# Patient Record
Sex: Male | Born: 1965 | Race: Black or African American | Hispanic: No | Marital: Married | Smoking: Never smoker
Health system: Southern US, Community
[De-identification: ages and names within clinical notes are randomized; demographics above are authoritative.]

## PROBLEM LIST (undated history)

## (undated) DIAGNOSIS — I10 Essential (primary) hypertension: Secondary | ICD-10-CM

---

## 2015-11-04 DIAGNOSIS — E669 Obesity, unspecified: Secondary | ICD-10-CM | POA: Insufficient documentation

## 2015-11-04 DIAGNOSIS — I10 Essential (primary) hypertension: Secondary | ICD-10-CM | POA: Insufficient documentation

## 2020-02-18 ENCOUNTER — Telehealth: Payer: Self-pay | Admitting: Emergency Medicine

## 2020-02-18 ENCOUNTER — Encounter: Payer: Self-pay | Admitting: Emergency Medicine

## 2020-02-18 ENCOUNTER — Other Ambulatory Visit: Payer: Self-pay

## 2020-02-18 ENCOUNTER — Emergency Department
Admission: EM | Admit: 2020-02-18 | Discharge: 2020-02-18 | Disposition: A | Discharge status: Home / Self Care | Payer: PRIVATE HEALTH INSURANCE | Source: Home / Self Care

## 2020-02-18 DIAGNOSIS — I10 Essential (primary) hypertension: Secondary | ICD-10-CM

## 2020-02-18 DIAGNOSIS — J069 Acute upper respiratory infection, unspecified: Secondary | ICD-10-CM

## 2020-02-18 DIAGNOSIS — J029 Acute pharyngitis, unspecified: Secondary | ICD-10-CM

## 2020-02-18 DIAGNOSIS — H9201 Otalgia, right ear: Secondary | ICD-10-CM

## 2020-02-18 DIAGNOSIS — Z760 Encounter for issue of repeat prescription: Secondary | ICD-10-CM

## 2020-02-18 HISTORY — DX: Essential (primary) hypertension: I10

## 2020-02-18 LAB — POCT RAPID STREP A (OFFICE): Rapid Strep A Screen: NEGATIVE

## 2020-02-18 MED ORDER — AMLODIPINE BESYLATE 10 MG PO TABS: 10.0000 mg | ORAL_TABLET | Freq: Every day | ORAL | 0 refills | Status: DC

## 2020-02-18 NOTE — Discharge Instructions (Signed)
You may take 500mg  acetaminophen every 4-6 hours or in combination with ibuprofen 400-600mg  every 6-8 hours as needed for pain, inflammation, and fever.  Be sure to well hydrated with clear liquids and get at least 8 hours of sleep at night, preferably more while sick.   Please follow up with family medicine in 1 week if needed.  Be sure to take your blood pressure medication as prescribed to help lower your risk of heart attack, stroke, kidney failure and early death.  Call to schedule a follow up appointment with primary care for ongoing healthcare needs and recheck of symptoms next week.

## 2020-02-18 NOTE — ED Triage Notes (Signed)
Right ear pain and r sided throat pain started yesterday -was sharp, no pain now  Denies fever or chills Took theraflu last night  Missed work today -will need a work note COVID vaccine - 2nd shot [employer] 12/22/19

## 2020-02-18 NOTE — Telephone Encounter (Signed)
Order for strep culture did not process at patient's visit earlier today - order placed for strep throat culture - sent out today

## 2020-02-18 NOTE — ED Provider Notes (Addendum)
[facility]    CSN: [id number] Arrival date & time: 02/18/20  0815      History   Chief Complaint Chief Complaint  Patient presents with  . Otalgia  . Sore Throat    HPI Oscar Miller is a 54 y.o. male.   HPI Oscar Miller is a 54 y.o. male presenting to UC with c/o Right ear pain and Right side throat pain that started yesterday.  Pain was sharp, associated post nasal drainage. He took Theraflu last night, which provided relief.  Denies pain at this time but has mild congestion.  Denies fever, chills, n/v/d. Others at work have also been sick this week. He is requesting a work note. Fully vaccinated with Pfizer COVID vaccine 12/22/19.   BP elevated in triage. Pt has been out of his amlodipine for a few months due to lack of PCP from lack of insurance. Pt recently received insurance and currently looking for a new PCP. Denies HA, dizziness, chest pain or SOB.  Past Medical History:  Diagnosis Date  . Hypertension     Patient Active Problem List   Diagnosis Date Noted  . Essential hypertension 11/04/2015  . Obesity 11/04/2015    History reviewed. No pertinent surgical history.     Home Medications    Prior to Admission medications   Medication Sig Start Date End Date Taking? Authorizing Provider  amLODipine (NORVASC) 10 MG tablet Take 1 tablet (10 mg total) by mouth daily. 02/18/20   Lurene Shadow, PA-C    Family History Family History  Problem Relation Age of Onset  . Heart failure Mother   . COPD Father     Social History Social History   Tobacco Use  . Smoking status: Never Smoker  . Smokeless tobacco: Never Used  Vaping Use  . Vaping Use: Never used  Substance Use Topics  . Alcohol use: Yes    Alcohol/week: 1.0 standard drink    Types: 1 Standard drinks or equivalent per week  . Drug use: Never     Allergies   Patient has no known allergies.   Review of Systems Review of Systems  Constitutional: Negative for chills and fever.   HENT: Positive for congestion, ear pain (Right) and sore throat. Negative for trouble swallowing and voice change.   Respiratory: Positive for cough ( minimal, clearing his throat). Negative for shortness of breath.   Cardiovascular: Negative for chest pain and palpitations.  Gastrointestinal: Negative for abdominal pain, diarrhea, nausea and vomiting.  Musculoskeletal: Negative for arthralgias, back pain and myalgias.  Skin: Negative for rash.  All other systems reviewed and are negative.    Physical Exam Triage Vital Signs ED Triage Vitals  Enc Vitals Group     BP 02/18/20 0837 (!) 163/115     Pulse Rate 02/18/20 0837 (!) 108     Resp 02/18/20 0837 18     Temp 02/18/20 0837 98.9 F (37.2 C)     Temp Source 02/18/20 0837 Oral     SpO2 02/18/20 0837 97 %     Weight 02/18/20 0841 238 lb (108 kg)     Height 02/18/20 0841 5\' 10"  (1.778 m)     Head Circumference --      Peak Flow --      Pain Score 02/18/20 0840 0     Pain Loc --      Pain Edu? --      Excl. in GC? --    No data found.  Updated  Vital Signs BP (!) 150/94 (BP Location: Right Arm)   Pulse 95   Temp 98.9 F (37.2 C) (Oral)   Resp 18   Ht 5\' 10"  (1.778 m)   Wt 238 lb (108 kg)   SpO2 97%   BMI 34.15 kg/m   Visual Acuity Right Eye Distance:   Left Eye Distance:   Bilateral Distance:    Right Eye Near:   Left Eye Near:    Bilateral Near:     Physical Exam Vitals and nursing note reviewed.  Constitutional:      General: He is not in acute distress.    Appearance: He is well-developed. He is not ill-appearing, toxic-appearing or diaphoretic.  HENT:     Head: Normocephalic and atraumatic.     Right Ear: Tympanic membrane and ear canal normal.     Left Ear: Tympanic membrane and ear canal normal.     Nose: Nose normal.     Right Sinus: No maxillary sinus tenderness or frontal sinus tenderness.     Left Sinus: No maxillary sinus tenderness or frontal sinus tenderness.     Mouth/Throat:     Lips:  Pink.     Mouth: Mucous membranes are moist.     Pharynx: Oropharynx is clear. Uvula midline. Posterior oropharyngeal erythema present. No pharyngeal swelling, oropharyngeal exudate or uvula swelling.  Cardiovascular:     Rate and Rhythm: Normal rate and regular rhythm.  Pulmonary:     Effort: Pulmonary effort is normal. No respiratory distress.     Breath sounds: Normal breath sounds. No stridor. No wheezing, rhonchi or rales.  Musculoskeletal:        General: Normal range of motion.     Cervical back: Normal range of motion and neck supple.  Lymphadenopathy:     Cervical: No cervical adenopathy.  Skin:    General: Skin is warm and dry.  Neurological:     Mental Status: He is alert and oriented to person, place, and time.  Psychiatric:        Behavior: Behavior normal.      UC Treatments / Results  Labs (all labs ordered are listed, but only abnormal results are displayed) Labs Reviewed  POCT RAPID STREP A (OFFICE)    EKG   Radiology No results found.  Procedures Procedures (including critical care time)  Medications Ordered in UC Medications - No data to display  Initial Impression / Assessment and Plan / UC Course  I have reviewed the triage vital signs and the nursing notes.  Pertinent labs & imaging results that were available during my care of the patient were reviewed by me and considered in my medical decision making (see chart for details).    Rapid strep Negative Hx and exam c/w viral illness Encouraged symptomatic tx AVS given  BP medication refilled. Encouraged to establish care with a PCP. AVS given.   Final Clinical Impressions(s) / UC Diagnoses   Final diagnoses:  Acute pharyngitis, unspecified etiology  Viral upper respiratory illness  Right ear pain  Uncontrolled hypertension  Medication refill     Discharge Instructions     You may take 500mg  acetaminophen every 4-6 hours or in combination with ibuprofen 400-600mg  every 6-8 hours  as needed for pain, inflammation, and fever.  Be sure to well hydrated with clear liquids and get at least 8 hours of sleep at night, preferably more while sick.   Please follow up with family medicine in 1 week if needed.  Be sure to  take your blood pressure medication as prescribed to help lower your risk of heart attack, stroke, kidney failure and early death.  Call to schedule a follow up appointment with primary care for ongoing healthcare needs and recheck of symptoms next week.     ED Prescriptions    Medication Sig Dispense Auth. Provider   amLODipine (NORVASC) 10 MG tablet Take 1 tablet (10 mg total) by mouth daily. 30 tablet Lurene Shadow, [place]     PDMP not reviewed this encounter.   Lurene Shadow, PA-C 02/18/20 1052    Lurene Shadow, [place] 02/18/20 1053

## 2020-02-18 NOTE — Telephone Encounter (Signed)
Order for strep culture cancelled - not required at this time per provider. Unable to cancel order- specimen not sent to [facility].

## 2020-02-25 ENCOUNTER — Emergency Department (INDEPENDENT_AMBULATORY_CARE_PROVIDER_SITE_OTHER)
Admission: EM | Admit: 2020-02-25 | Discharge: 2020-02-25 | Disposition: A | Discharge status: Home / Self Care | Payer: PRIVATE HEALTH INSURANCE | Source: Home / Self Care

## 2020-02-25 ENCOUNTER — Other Ambulatory Visit: Payer: Self-pay

## 2020-02-25 ENCOUNTER — Emergency Department (INDEPENDENT_AMBULATORY_CARE_PROVIDER_SITE_OTHER): Payer: PRIVATE HEALTH INSURANCE

## 2020-02-25 DIAGNOSIS — J069 Acute upper respiratory infection, unspecified: Secondary | ICD-10-CM

## 2020-02-25 DIAGNOSIS — J0100 Acute maxillary sinusitis, unspecified: Secondary | ICD-10-CM

## 2020-02-25 DIAGNOSIS — I10 Essential (primary) hypertension: Secondary | ICD-10-CM

## 2020-02-25 MED ORDER — BENZONATATE 100 MG PO CAPS
100.0000 mg | ORAL_CAPSULE | Freq: Three times a day (TID) | ORAL | 0 refills | Status: AC
Start: 2020-02-25 — End: ?

## 2020-02-25 MED ORDER — AMOXICILLIN-POT CLAVULANATE 875-125 MG PO TABS: 1.0000 | ORAL_TABLET | Freq: Two times a day (BID) | ORAL | 0 refills | Status: AC

## 2020-02-25 NOTE — Discharge Instructions (Signed)
  You may take 500mg  acetaminophen every 4-6 hours or in combination with ibuprofen 400-600mg  every 6-8 hours as needed for pain, inflammation, and fever.  Be sure to well hydrated with clear liquids and get at least 8 hours of sleep at night, preferably more while sick.   Please follow up with family medicine next week if not improving and for blood pressure management.

## 2020-02-25 NOTE — ED Provider Notes (Signed)
[facility]    CSN: [id number] Arrival date & time: 02/25/20  1724      History   Chief Complaint Chief Complaint  Patient presents with  . Cough    HPI Oscar Miller is a 54 y.o. male.   HPI  Oscar Miller is a 54 y.o. male presenting to UC with c/o gradually worsening cough, congestion, sinus pressure, sore throat and ear pain. He was seen last week for similar symptoms, the day after they started. Denies fever, chills, n/v/d. No chest pain or SOB but cough has been keeping him up at night.  He was fully vaccinated with Pfizer COVID vaccine beginning of September.    BP elevated. He restarted his BP medication 1 week ago and has new patient appointment with PCP 03/03/20. Denies HA, dizziness, chest pain or SOB.    Past Medical History:  Diagnosis Date  . Hypertension     Patient Active Problem List   Diagnosis Date Noted  . Essential hypertension 11/04/2015  . Obesity 11/04/2015    History reviewed. No pertinent surgical history.     Home Medications    Prior to Admission medications   Medication Sig Start Date End Date Taking? Authorizing Provider  amLODipine (NORVASC) 10 MG tablet Take 1 tablet (10 mg total) by mouth daily. 02/18/20  Yes Duru Reiger O, PA-C  amoxicillin-clavulanate (AUGMENTIN) 875-125 MG tablet Take 1 tablet by mouth 2 (two) times daily. One po bid x 7 days 02/25/20   Lurene Shadow, PA-C  benzonatate (TESSALON) 100 MG capsule Take 1-2 capsules (100-200 mg total) by mouth every 8 (eight) hours. 02/25/20   Lurene Shadow, PA-C    Family History Family History  Problem Relation Age of Onset  . Heart failure Mother   . COPD Father     Social History Social History   Tobacco Use  . Smoking status: Never Smoker  . Smokeless tobacco: Never Used  Vaping Use  . Vaping Use: Never used  Substance Use Topics  . Alcohol use: Not Currently    Alcohol/week: 1.0 standard drink    Types: 1 Standard drinks or equivalent per week    . Drug use: Never     Allergies   Patient has no known allergies.   Review of Systems Review of Systems  Constitutional: Negative for chills and fever.  HENT: Positive for congestion, ear pain, sinus pressure, sinus pain, sore throat and voice change (hoarse). Negative for trouble swallowing.   Respiratory: Positive for cough. Negative for shortness of breath.   Cardiovascular: Negative for chest pain and palpitations.  Gastrointestinal: Negative for abdominal pain, diarrhea, nausea and vomiting.  Musculoskeletal: Negative for arthralgias, back pain and myalgias.  Skin: Negative for rash.  All other systems reviewed and are negative.    Physical Exam Triage Vital Signs ED Triage Vitals  Enc Vitals Group     BP 02/25/20 1736 (!) 155/104     Pulse Rate 02/25/20 1736 97     Resp 02/25/20 1736 18     Temp 02/25/20 1736 98.8 F (37.1 C)     Temp Source 02/25/20 1736 Oral     SpO2 02/25/20 1736 97 %     Weight --      Height --      Head Circumference --      Peak Flow --      Pain Score 02/25/20 1735 3     Pain Loc --      Pain Edu? --  Excl. in GC? --    No data found.  Updated Vital Signs BP (!) 155/104 (BP Location: Right Arm)   Pulse 97   Temp 98.8 F (37.1 C) (Oral)   Resp 18   SpO2 97%   Visual Acuity Right Eye Distance:   Left Eye Distance:   Bilateral Distance:    Right Eye Near:   Left Eye Near:    Bilateral Near:     Physical Exam Vitals and nursing note reviewed.  Constitutional:      General: He is not in acute distress.    Appearance: Normal appearance. He is well-developed. He is not ill-appearing, toxic-appearing or diaphoretic.  HENT:     Head: Normocephalic and atraumatic.     Right Ear: Tympanic membrane and ear canal normal.     Left Ear: Tympanic membrane and ear canal normal.     Nose: Congestion present.     Right Sinus: No maxillary sinus tenderness or frontal sinus tenderness.     Left Sinus: Maxillary sinus tenderness  present. No frontal sinus tenderness.     Mouth/Throat:     Lips: Pink.     Mouth: Mucous membranes are moist.     Pharynx: Oropharynx is clear. Uvula midline. No pharyngeal swelling, oropharyngeal exudate, posterior oropharyngeal erythema or uvula swelling.  Cardiovascular:     Rate and Rhythm: Normal rate and regular rhythm.  Pulmonary:     Effort: Pulmonary effort is normal. No respiratory distress.     Breath sounds: Normal breath sounds. No stridor. No wheezing, rhonchi or rales.  Musculoskeletal:        General: Normal range of motion.     Cervical back: Normal range of motion and neck supple. No tenderness.  Lymphadenopathy:     Cervical: No cervical adenopathy.  Skin:    General: Skin is warm and dry.  Neurological:     Mental Status: He is alert and oriented to person, place, and time.  Psychiatric:        Behavior: Behavior normal.      UC Treatments / Results  Labs (all labs ordered are listed, but only abnormal results are displayed) Labs Reviewed  NOVEL CORONAVIRUS, NAA    EKG   Radiology DG Chest 2 View  Result Date: 02/25/2020 CLINICAL DATA:  Cough and congestion for 5 days EXAM: CHEST - 2 VIEW COMPARISON:  None. FINDINGS: The heart size and mediastinal contours are within normal limits. Both lungs are clear. The visualized skeletal structures are unremarkable. IMPRESSION: No active cardiopulmonary disease. Electronically Signed   By: Sharlet Salina M.D.   On: 02/25/2020 18:29    Procedures Procedures (including critical care time)  Medications Ordered in UC Medications - No data to display  Initial Impression / Assessment and Plan / UC Course  I have reviewed the triage vital signs and the nursing notes.  Pertinent labs & imaging results that were available during my care of the patient were reviewed by me and considered in my medical decision making (see chart for details).    Discussed imaging with pt Will tx sinus symptoms with Augmentin,  encouraged sinus rinses F/u with PCP next week as previously scheduled for symptom and BP recheck. AVS given.  Final Clinical Impressions(s) / UC Diagnoses   Final diagnoses:  Acute upper respiratory infection  Acute non-recurrent maxillary sinusitis  Uncontrolled hypertension     Discharge Instructions      You may take 500mg  acetaminophen every 4-6 hours or in combination with  ibuprofen 400-600mg  every 6-8 hours as needed for pain, inflammation, and fever.  Be sure to well hydrated with clear liquids and get at least 8 hours of sleep at night, preferably more while sick.   Please follow up with family medicine next week if not improving and for blood pressure management.      ED Prescriptions    Medication Sig Dispense Auth. Provider   amoxicillin-clavulanate (AUGMENTIN) 875-125 MG tablet Take 1 tablet by mouth 2 (two) times daily. One po bid x 7 days 14 tablet Ruthann Angulo O, PA-C   benzonatate (TESSALON) 100 MG capsule Take 1-2 capsules (100-200 mg total) by mouth every 8 (eight) hours. 21 capsule Lurene Shadow, [place]     PDMP not reviewed this encounter.   Lurene Shadow, [place] 02/25/20 1911

## 2020-02-25 NOTE — ED Triage Notes (Signed)
Patient presents to Urgent Care with complaints of continued sinus pressure, sore throat, ear pain, and cough since about 8 days ago. Patient reports he was evaluated last week and was not given anything for his symptoms. Thinks he needs an antibiotic, was not tested for covid last week, was tested for strep and it was negative. Would like a work note.

## 2020-02-27 LAB — NOVEL CORONAVIRUS, NAA: SARS-CoV-2, NAA: NOT DETECTED

## 2020-02-27 LAB — SARS-COV-2, NAA 2 DAY TAT

## 2020-03-03 ENCOUNTER — Ambulatory Visit: Payer: PRIVATE HEALTH INSURANCE | Admitting: Family Medicine

## 2020-04-01 ENCOUNTER — Ambulatory Visit: Payer: PRIVATE HEALTH INSURANCE | Admitting: Family Medicine

## 2020-04-22 ENCOUNTER — Ambulatory Visit: Payer: PRIVATE HEALTH INSURANCE | Admitting: Family Medicine

## 2020-06-03 ENCOUNTER — Emergency Department (INDEPENDENT_AMBULATORY_CARE_PROVIDER_SITE_OTHER)
Admission: EM | Admit: 2020-06-03 | Discharge: 2020-06-03 | Disposition: A | Discharge status: Home / Self Care | Payer: PRIVATE HEALTH INSURANCE | Source: Home / Self Care

## 2020-06-03 DIAGNOSIS — I10 Essential (primary) hypertension: Secondary | ICD-10-CM | POA: Diagnosis not present

## 2020-06-03 DIAGNOSIS — J3489 Other specified disorders of nose and nasal sinuses: Secondary | ICD-10-CM

## 2020-06-03 DIAGNOSIS — R0981 Nasal congestion: Secondary | ICD-10-CM

## 2020-06-03 MED ORDER — IPRATROPIUM BROMIDE 0.03 % NA SOLN: 2.0000 | Freq: Three times a day (TID) | NASAL | 0 refills | Status: AC | PRN

## 2020-06-03 MED ORDER — LEVOCETIRIZINE DIHYDROCHLORIDE 5 MG PO TABS: 5.0000 mg | ORAL_TABLET | Freq: Every evening | ORAL | 0 refills | Status: AC

## 2020-06-03 MED ORDER — AMLODIPINE BESYLATE 10 MG PO TABS: 10.0000 mg | ORAL_TABLET | Freq: Every day | ORAL | 0 refills | Status: AC

## 2020-06-03 NOTE — ED Triage Notes (Signed)
Patient presents to Urgent Care with complaints of requesting a work note extension since he tested positive for covid on 05/21/2020. Patient reports he still has nasal congestion and a slight headache, wondering if he should return to work or stay out longer.

## 2020-06-25 ENCOUNTER — Other Ambulatory Visit: Payer: Self-pay | Admitting: Family Medicine

## 2021-12-24 IMAGING — DX DG CHEST 2V
2 series · 2 of 2 positions shown · non-contrast
Comparison: None.

CLINICAL DATA: Cough and congestion for 5 days

EXAM:
CHEST - 2 VIEW

[chest pa]
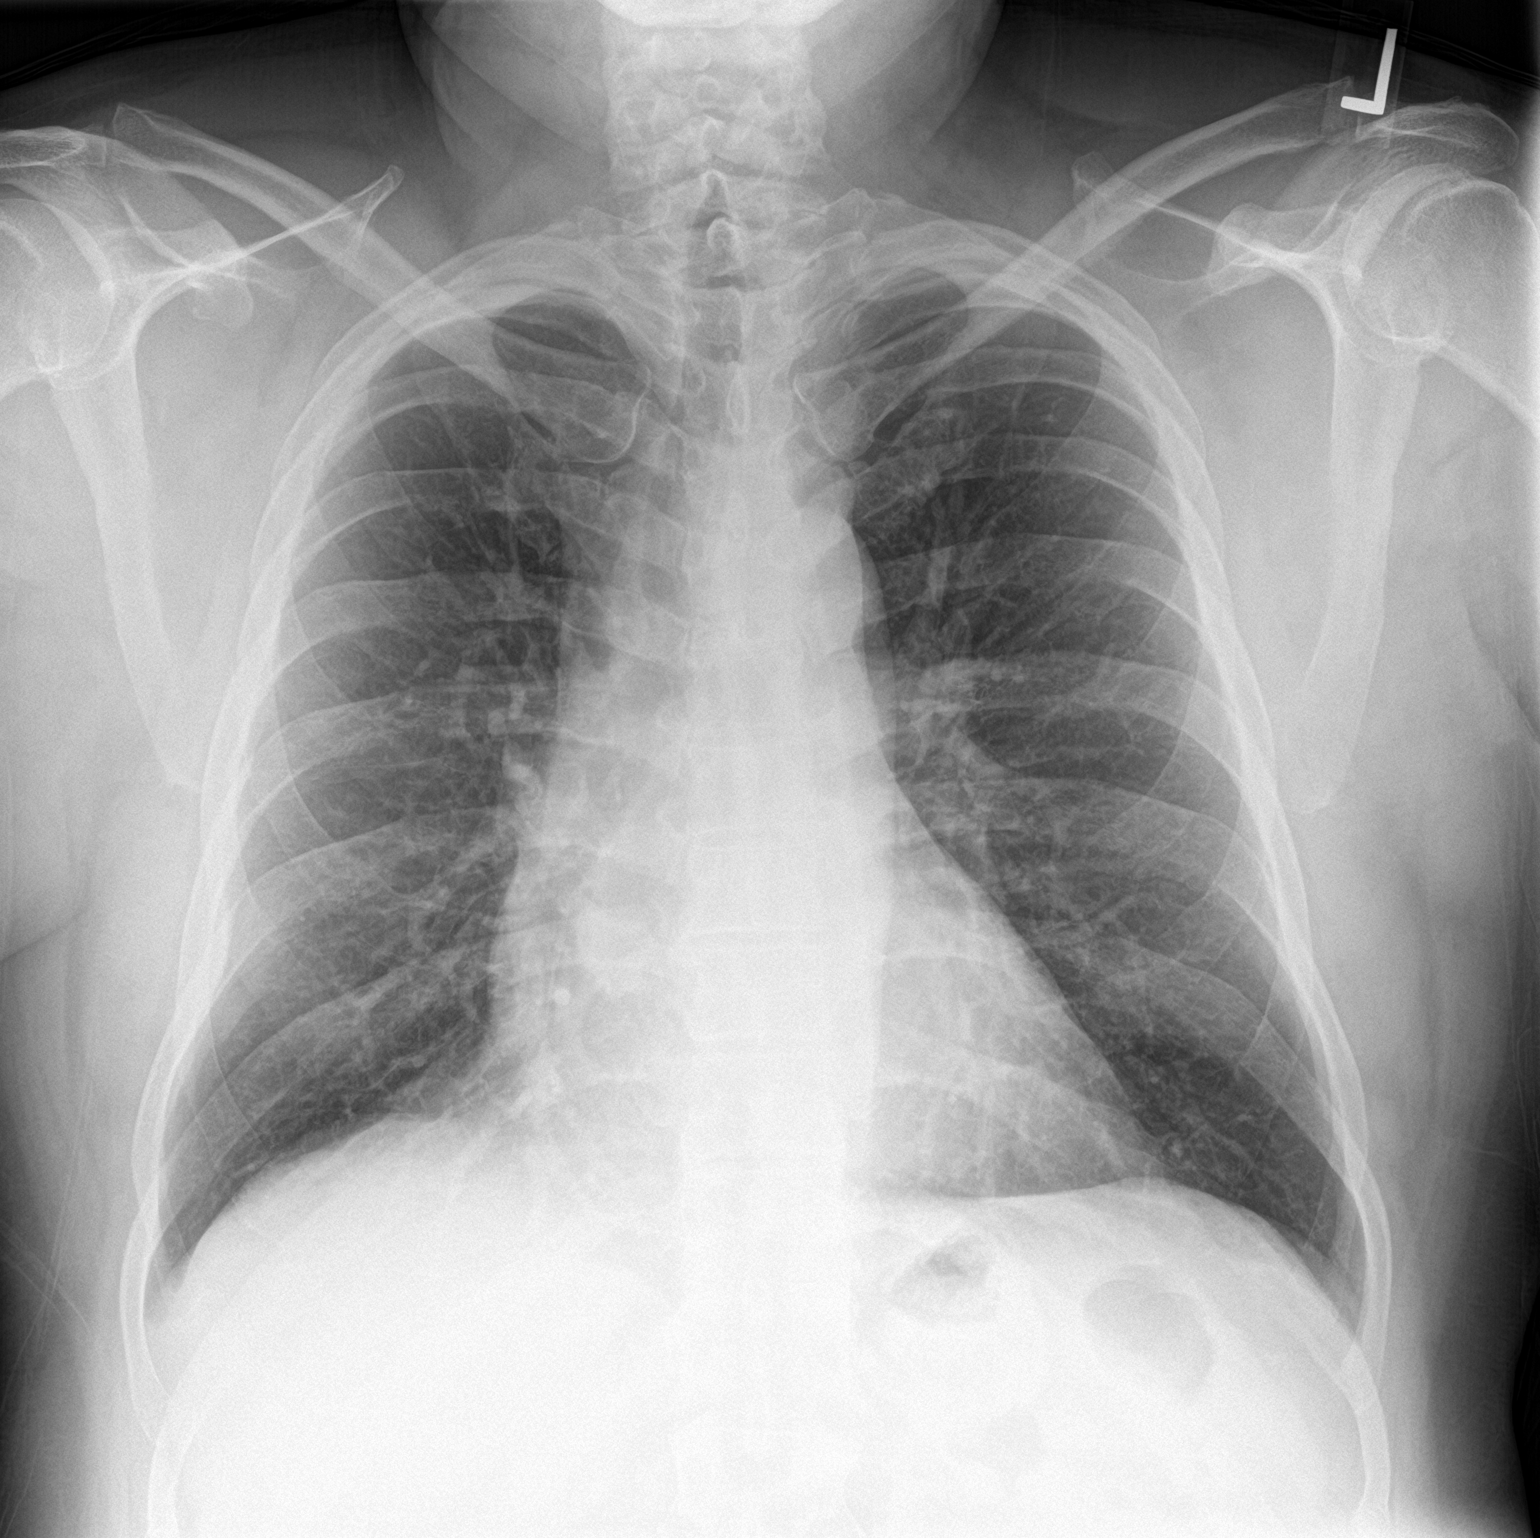

[chest lat]
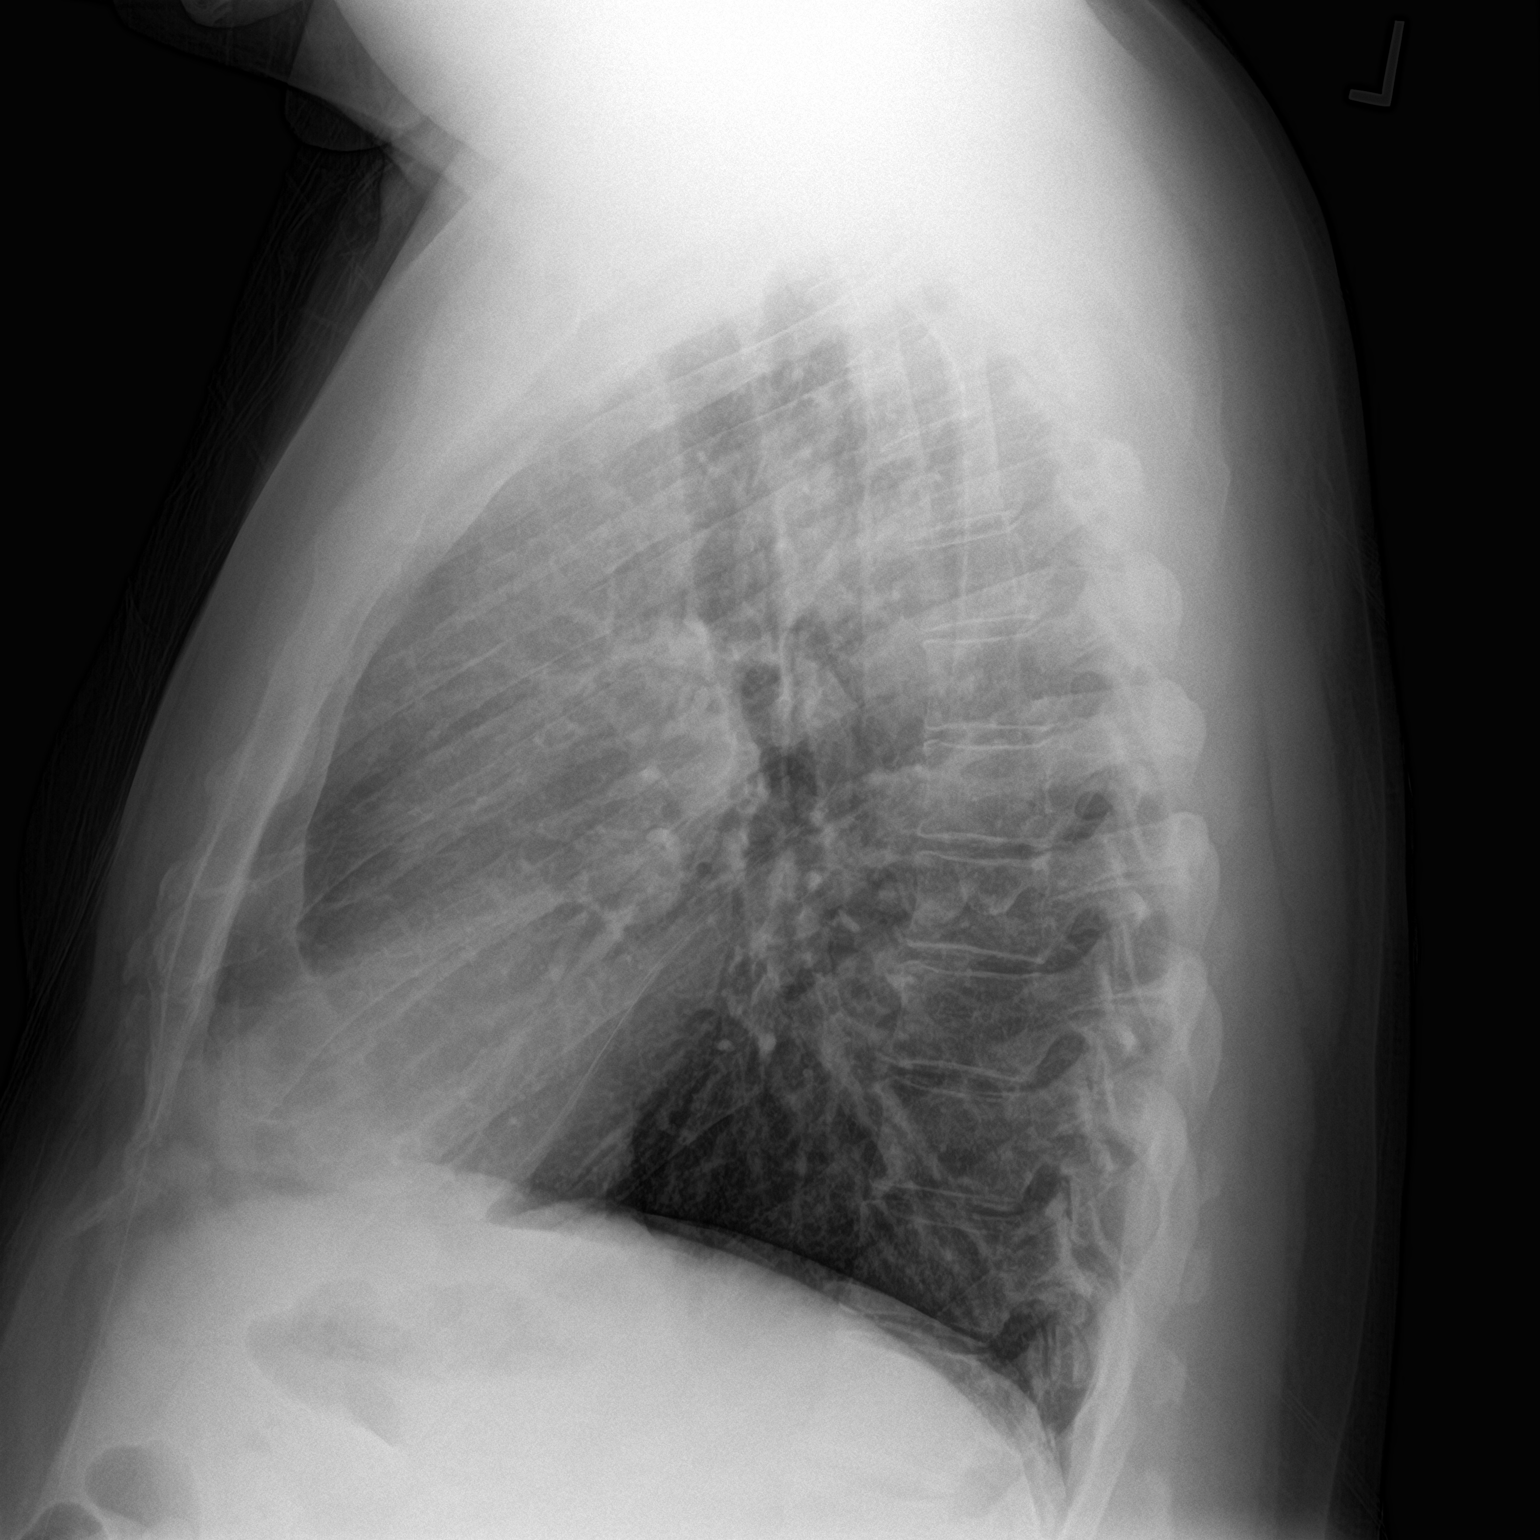

[2 of 2 positions shown; findings below may reference images not displayed]

FINDINGS: The heart size and mediastinal contours are within normal limits.
Both lungs are clear. The visualized skeletal structures are
unremarkable.
IMPRESSION: No active cardiopulmonary disease.
